# Patient Record
Sex: Female | Born: 1970 | Race: White | Hispanic: No | Marital: Married | Smoking: Former smoker
Health system: Southern US, Community
[De-identification: ages and names within clinical notes are randomized; demographics above are authoritative.]

## PROBLEM LIST (undated history)

## (undated) DIAGNOSIS — F32A Depression, unspecified: Secondary | ICD-10-CM

## (undated) DIAGNOSIS — I10 Essential (primary) hypertension: Secondary | ICD-10-CM

## (undated) DIAGNOSIS — F329 Major depressive disorder, single episode, unspecified: Secondary | ICD-10-CM

## (undated) DIAGNOSIS — F419 Anxiety disorder, unspecified: Secondary | ICD-10-CM

## (undated) HISTORY — DX: Essential (primary) hypertension: I10

## (undated) HISTORY — PX: OOPHORECTOMY: SHX86

## (undated) HISTORY — DX: Anxiety disorder, unspecified: F41.9

## (undated) HISTORY — DX: Major depressive disorder, single episode, unspecified: F32.9

## (undated) HISTORY — DX: Depression, unspecified: F32.A

## (undated) HISTORY — PX: ENDOMETRIAL ABLATION W/ NOVASURE: SUR434

---

## 2008-05-02 ENCOUNTER — Emergency Department (HOSPITAL_BASED_OUTPATIENT_CLINIC_OR_DEPARTMENT_OTHER): Admission: EM | Admit: 2008-05-02 | Discharge: 2008-05-02 | Payer: Self-pay | Admitting: Emergency Medicine

## 2009-01-27 ENCOUNTER — Encounter: Admission: RE | Admit: 2009-01-27 | Discharge: 2009-01-27 | Payer: Self-pay | Admitting: Orthopedic Surgery

## 2010-05-28 IMAGING — CT CT EXTREM LOW W/O CM*R*
2 of 4 series · 4 of 14 positions shown, 5 images · non-contrast
Comparison: Right foot radiographs 05/02/2008

CLINICAL DATA: History of a fifth metatarsal fracture.  Pain and
swelling.

CT RIGHT FOOT WITHOUT CONTRAST
TECHNIQUE: Multidetector CT imaging of the right foot was
performed according to the standard protocol without intravenous
contrast. Multiplanar CT image reconstructions were also generated.
CLINICAL DATA: 3-DIMENSIONAL CT IMAGE RENDERING AT INDEPENDENT WORKSTATION:
TECHNIQUE: 3-dimensional CT images were rendered by post-processing
of the original CT data at independent workstation.  The 3-
dimensional CT images were interpreted, and findings were reported
in the accompanying complete CT report for this study.

[Series 3: rt foot/bone · axial · 0.39mm/px · z∈[-214,-131]mm · 2 of 99 slices shown, 3 images]
[im 33/99  soft-tissue]
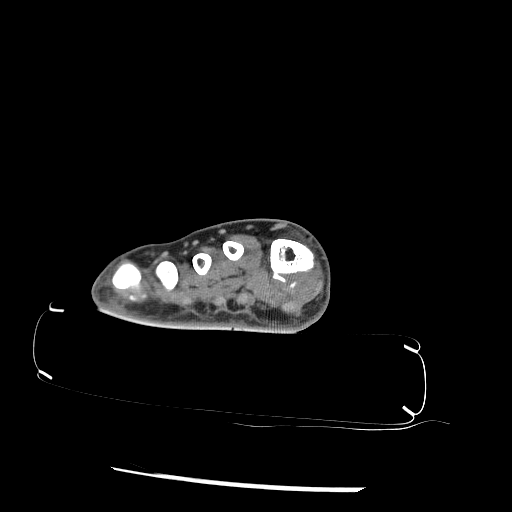
[im 33/99  bone]
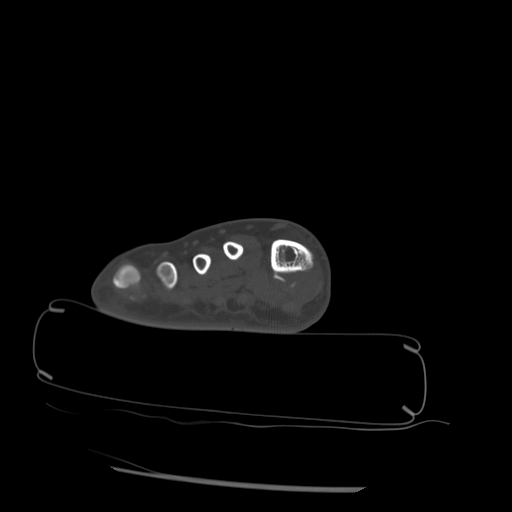
[im 66/99  bone]
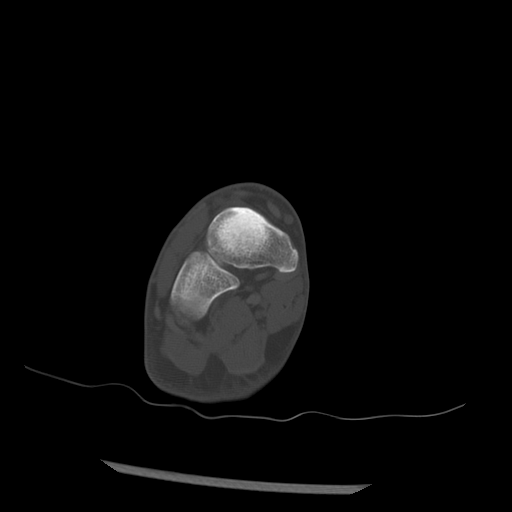

[Series 4: rt foot detail · axial · 0.39mm/px · z∈[-214,-131]mm · 2 of 99 slices shown]
[im 33/99  bone]
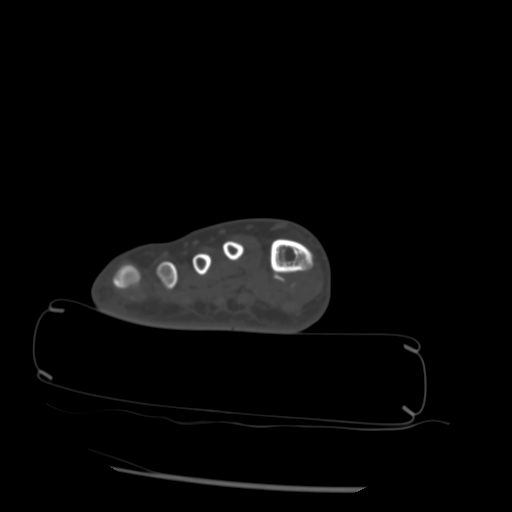
[im 66/99  bone]
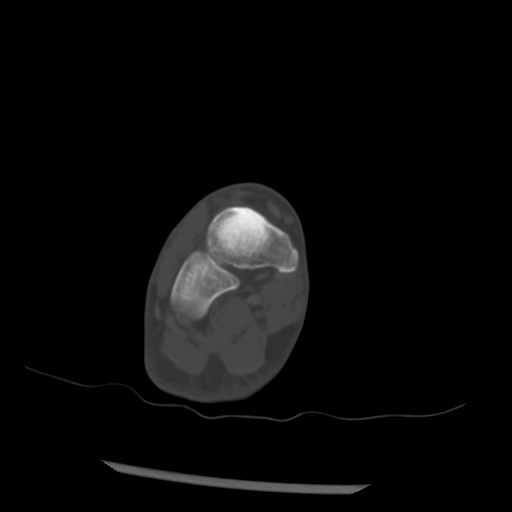

[4 of 14 positions shown; findings below may reference images not displayed]

FINDINGS: There is complete and solid union of the fifth
metatarsal fracture.  At the distal aspect of the fracture site
there is a bony protuberance near the metatarsal neck.  This
projects laterally and could be a potential site for soft tissue
irritation or adventitial bursa formation.  MRI may be helpful for
further evaluation if that is a clinical concern.

There are degenerative changes noted at the first metatarsal
phalangeal joint with joint space narrowing, osteophytic spurring
and early subchondral cystic change. The remaining joint spaces are
maintained.  No arthropathic changes.  No stress fracture or
avascular necrosis.
IMPRESSION: 1.  Complete and solid union of the fifth metatarsal fracture.
2.  Bony spurring change at the distal aspect of the fracture site
laterally near the metatarsal neck.  This could be a source of
adjacent soft tissue irritation or adventitial bursa formation.
3.  Degenerative changes at the first metatarsal phalangeal joint.

## 2013-07-09 ENCOUNTER — Ambulatory Visit: Payer: Self-pay

## 2013-07-09 ENCOUNTER — Encounter: Payer: Self-pay | Admitting: Podiatrist

## 2013-07-09 ENCOUNTER — Ambulatory Visit (INDEPENDENT_AMBULATORY_CARE_PROVIDER_SITE_OTHER): Payer: BC Managed Care – PPO

## 2013-07-09 ENCOUNTER — Ambulatory Visit (INDEPENDENT_AMBULATORY_CARE_PROVIDER_SITE_OTHER): Payer: BC Managed Care – PPO | Admitting: Podiatrist

## 2013-07-09 VITALS — BP 148/83 | HR 72 | Resp 16 | Ht 67.0 in | Wt 185.0 lb

## 2013-07-09 DIAGNOSIS — M2010 Hallux valgus (acquired), unspecified foot: Secondary | ICD-10-CM

## 2013-07-09 DIAGNOSIS — M2011 Hallux valgus (acquired), right foot: Secondary | ICD-10-CM

## 2013-07-09 DIAGNOSIS — M205X9 Other deformities of toe(s) (acquired), unspecified foot: Secondary | ICD-10-CM

## 2013-07-09 DIAGNOSIS — M205X1 Other deformities of toe(s) (acquired), right foot: Secondary | ICD-10-CM

## 2013-07-09 NOTE — Progress Notes (Signed)
MRN: [id number] Name: Judith Franco  Sex: female Age: 42 y.o. DOB: Apr 13, 1971  Provider: Marlowe Aschoff P  Allergies: Aspirin   Chief Complaint  Patient presents with  . Bunions    "I have severe pain in this area on my right foot.  I can hardly wear shoes."     HPI: Patient is 42 y.o. female who presents today for pain on the right foot as she points to the first metatarsal phalangeal joint.  The patient states that she has pain in shoes especially ballet type flats where the shoe cuts across her first metatarsal head. She also states she has pain with dorsiflexion and plantarflexion of the joint and she has noticed swelling of her right foot at the joint compared to the left. Patient states she's been experiencing this pain for several months to a years duration and it's gotten to the point where interrupts her daily activities. She has tried anti-inflammatory medications with minimal relief in symptoms     Past Medical History  Diagnosis Date  . Hypertension   . Anxiety   . Depression        Medication List       This list is accurate as of: 07/09/13  5:32 PM.  Always use your most recent med list.               FLUoxetine 40 MG capsule  Commonly known as:  PROZAC  Take 40 mg by mouth daily.     valsartan 160 MG tablet  Commonly known as:  DIOVAN  Take 160 mg by mouth daily.     zolpidem 5 MG tablet  Commonly known as:  AMBIEN  Take 5 mg by mouth at bedtime as needed for sleep.        Meds ordered this encounter  Medications  . FLUoxetine (PROZAC) 40 MG capsule    Sig: Take 40 mg by mouth daily.  . valsartan (DIOVAN) 160 MG tablet    Sig: Take 160 mg by mouth daily.  Marland Kitchen zolpidem (AMBIEN) 5 MG tablet    Sig: Take 5 mg by mouth at bedtime as needed for sleep.    Past Surgical History  Procedure Laterality Date  . Oophorectomy Right   . Endometrial ablation w/ novasure N/A      History  Substance Use Topics  . Smoking status: Former Games developer  .  Smokeless tobacco: Not on file     Comment: socially  . Alcohol Use: Yes     Comment: socially    Family History  Problem Relation Age of Onset  . Arthritis Mother   . Cancer Mother   . Hyperlipidemia Mother   . Heart disease Father   . Hypertension Father     Review of Systems  DATA OBTAINED: from patient intake form GENERAL: Feels well no fevers, no fatigue, no changes in appetite, + fatigue, + weight change SKIN: No itching, no rashes, no open lesions, no wounds EYES: No eye pain,no redness, no discharge EARS: No earache,no ringing of ears, no recent change in hearing NOSE: No congestion, no drainage, no bleeding  MOUTH/THROAT: No mouth pain, No sore throat, No difficulty chewing or swallowing  RESPIRATORY: No cough, no wheezing, no SOB CARDIAC: No chest pain,no heart palpitations,no new onset lower extremity edema  GI: No abdominal pain, No Nausea, no vomiting, no diarrhea, no heartburn or no reflux  GU: No dysuria, no increased frequency or urgency MUSCULOSKELETAL:swelling of the right foot, arthritis of knees b/l NEUROLOGIC: Awake,  alert, appropriate to situation, No recent change in mental status. PSYCHIATRIC: No overt anxiety or sadness.No behavior issue.  AMBULATION:  Ambulates unassisted  Filed Vitals:   07/09/13 1522  BP: 148/83  Pulse: 72  Resp: 16    Physical Exam  GENERAL APPEARANCE: Alert, conversant. Appropriately groomed. No acute distress.  VASCULAR: Pedal pulses palpable and strong bilateral.  Capillary refill time is immediate to all digits,  Proximal to distal cooling it warm to warm.  Digital hair growth is present bilateral  NEUROLOGIC: sensation is intact epicritically and protectively to 5.07 monofilament at 5/5 sites bilateral.  Light touch is intact bilateral, vibratory sensation intact bilateral, achilles tendon reflex is intact bilateral.  MUSCULOSKELETAL: acceptable muscle strength, tone and stability bilateral.  Intrinsic muscluature intact  bilateral.  Rectus appearance of foot and digits noted left.  The right foot has a large dorsal spur at the first metatarsal head. Swelling about the first metatarsophalangeal joint is also noted right in comparison to the left appear decreased range of motion with crepitus is also seen right. Pain with range of motion and at end range of motion is also noted. DERMATOLOGIC: skin color, texture, and turger are within normal limits.  No preulcerative lesions are seen, no interdigital maceration noted.  No open lesions present.  Digital nails are asymptomatic.    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 Reviewed the x-rays together and x-ray findings were discussed with the patient. Discussed that her arthritis is a progressive deformity and will most likely only get worse. Discussed conservative therapies including stiff soled shoes and decrease in activity. Surgical options were also discussed. We did discuss fixing the joint with an implant versus a fusion. Discussed that because of her age I would recommend a Silastic implant. The patient relates that she does not run and she does not wear high-heeled shoes and I do feel that this implant would be certainly beneficial for her in decreasing her pain and increasing function to her foot. He did go over the consent form patient's questions were encouraged and answered to the best of my ability. Patient was given information regarding the surgery and aftercare and the foot surgery booklet. Will be seen for her surgical correction if she has any questions prior to that visit she will call to be seen one week postoperatively.     Delories Heinz, DPM

## 2013-07-09 NOTE — Patient Instructions (Signed)
You have been diagnosed with hallux limitus (arthritis of your great toe joint).  The procedure to fix this is a joint replacement with a silastic implant.  Please see the "foot surgery" booklet for specific information on the procedure and aftercare.  If you have any questions or concerns regarding the procedure, please let me know

## 2013-07-14 ENCOUNTER — Telehealth: Payer: Self-pay | Admitting: *Deleted

## 2013-07-14 NOTE — Telephone Encounter (Signed)
Pt asked if could change surgery from 07/27/2013 to another Monday.  I was unable to leave a message for pt.

## 2013-08-05 ENCOUNTER — Encounter: Payer: BC Managed Care – PPO | Admitting: Podiatrist

## 2013-08-24 ENCOUNTER — Encounter: Payer: Self-pay | Admitting: Podiatrist

## 2013-08-24 DIAGNOSIS — M2020 Hallux rigidus, unspecified foot: Secondary | ICD-10-CM

## 2013-08-25 ENCOUNTER — Encounter: Payer: Self-pay | Admitting: *Deleted

## 2013-09-02 ENCOUNTER — Ambulatory Visit (INDEPENDENT_AMBULATORY_CARE_PROVIDER_SITE_OTHER): Payer: BC Managed Care – PPO

## 2013-09-02 ENCOUNTER — Ambulatory Visit (INDEPENDENT_AMBULATORY_CARE_PROVIDER_SITE_OTHER): Payer: BC Managed Care – PPO | Admitting: Podiatrist

## 2013-09-02 ENCOUNTER — Encounter: Payer: Self-pay | Admitting: Podiatrist

## 2013-09-02 VITALS — BP 151/90 | HR 71 | Resp 15 | Ht 67.0 in | Wt 185.0 lb

## 2013-09-02 DIAGNOSIS — Z9889 Other specified postprocedural states: Secondary | ICD-10-CM

## 2013-09-02 MED ORDER — HYDROCODONE-ACETAMINOPHEN 5-325 MG PO TABS: 1.0000 | ORAL_TABLET | ORAL | Status: DC | PRN

## 2013-09-02 NOTE — Patient Instructions (Signed)
You may get your foot wet.  Be sure to dry it off well after a shower.  Do not submerge your foot yet-  Start range of motion exercises on your great toe joint.  Popping and cracking are totally normal and you may also notice some swelling afterwards-  Wear an ace bandage or your anklet to help with the swelling.  You may wear either your small surgical shoe. Or your large air fracture walker boot as you prefer.

## 2013-09-02 NOTE — Progress Notes (Signed)
Subjective: Judith Franco presents today one week status post Judith Franco bunion arthroplasty with implant. She states "its doing okay, started work Monday".  She is complaining of some sharp discomfort at night. It may be due to the boot.  Pain medication is helping with her discomfort.  Objective: Excellent clinical appearance of the foot is present. Swelling is minimal incision line is well coapted with sutures and Steri-Strips in place. No redness, no swelling, no malodor, no drainage is present. No pain with calf compression is noted. Overall excellent alignment and position is seen. Great range of motion is also noted.  Assessment: Status post Judith Franco bunion with implant right foot date of surgery 08/24/2013  Plan: Gave instructions for bathing and aftercare. Also dispensed a Darco shoe for her use and instructed her that she may begin the use this as well. Recommended range of motion exercises and I will see her back for recheck in 2 weeks. If any problems or concerns arise prior that visit she is instructed to call.  Marlowe Aschoff DPM

## 2013-09-17 ENCOUNTER — Encounter: Payer: Self-pay | Admitting: Podiatrist

## 2013-09-17 ENCOUNTER — Ambulatory Visit (INDEPENDENT_AMBULATORY_CARE_PROVIDER_SITE_OTHER): Payer: BC Managed Care – PPO | Admitting: Podiatrist

## 2013-09-17 VITALS — BP 154/96 | HR 79 | Resp 12

## 2013-09-17 DIAGNOSIS — Z9889 Other specified postprocedural states: Secondary | ICD-10-CM

## 2013-09-17 NOTE — Progress Notes (Signed)
Subjective: Judith Franco presents today 3 weeks status post Keller bunion arthroplasty with implant. She states "its doing okay". She has been working and been able to prop her foot up. She states it's been swelling a little bit more since she's been working. Overall however she states she's doing pretty good   Objective: Excellent clinical appearance of the foot is seen. Swelling is minimal . The incision is healed. No redness, no swelling, no malodor, no drainage is present. No pain with calf compression is noted. Overall excellent alignment and position is seen. Great range of motion in the great toe joint is also noted.   Assessment: Status post Lorenz Coaster bunion with implant right foot date of surgery 08/24/2013   Plan: Are in are removed patient instructed she may begin to wear a supportive sneaker or tennis shoe.  Recommended continued range of motion exercises and I will see her back for recheck in one month. If any problems or concerns arise prior that visit she is instructed to call.   Marlowe Aschoff DPM

## 2013-09-17 NOTE — Patient Instructions (Signed)
Wear the surgigrip socky things -- which ever size is most comfortable.  Try MEDERMA pm for the incision.  Make sure to move the joint up and down as much as possible.  You can put your foot on the floor and lift up your heel while sitting which is a good exercise.  Or move the toe with your hands.

## 2013-10-05 NOTE — Progress Notes (Signed)
1) Keller bunion implant great toe joint right foot

## 2013-10-12 NOTE — Progress Notes (Signed)
1) Keller bunion implant right foot 

## 2013-10-13 ENCOUNTER — Telehealth: Payer: Self-pay | Admitting: *Deleted

## 2013-10-13 MED ORDER — AMOXICILLIN 500 MG PO CAPS: 1000.0000 mg | ORAL_CAPSULE | Freq: Once | ORAL | Status: DC

## 2013-10-13 NOTE — Telephone Encounter (Signed)
Message copied by Kristian CoveyPREVETTE, Sayra Frisby E on Tue Oct 13, 2013  2:35 PM ------      Message from: Delories HeinzEGERTON, KATHRYN P      Created: Tue Oct 13, 2013 10:36 AM       No we don't  do an antibiotic for dental procedures.  If the dentist would like something called in its amoxicillin 2 grams 1 hour prior to procedure. (which would be 500mg  x 4 tabs-- i think!)  Thanks.        ----- Message -----         From: Kristian CoveyAshley E Abeer Deskins, PMAC         Sent: 10/13/2013   8:24 AM           To: Marlowe AschoffKathryn Egerton, DPM            Pt dental office called this morning asking if pt needed antibiotics prior to her dental cleaning since she had a joint replacement surgery. Requesting an antibiotic be called in.            Do we normally do this? I know it is sometimes necessary for other orthopedic surgery, but was unsure for us?             Just curious what our actual protocol is!       ------

## 2013-10-13 NOTE — Telephone Encounter (Signed)
We have no pharmacy info on the pt. Script was printed and left for to pick up. Notified pt that this was done and gave  Instructions regarding medication. Informed Dr. Tawana ScaleWilson's office as well.

## 2013-10-14 ENCOUNTER — Other Ambulatory Visit: Payer: Self-pay | Admitting: *Deleted

## 2013-10-14 MED ORDER — AMOXICILLIN 500 MG PO CAPS: 1000.0000 mg | ORAL_CAPSULE | Freq: Once | ORAL | Status: DC

## 2013-10-16 ENCOUNTER — Encounter: Payer: Self-pay | Admitting: Podiatrist

## 2013-10-16 ENCOUNTER — Ambulatory Visit (INDEPENDENT_AMBULATORY_CARE_PROVIDER_SITE_OTHER): Payer: BC Managed Care – PPO | Admitting: Podiatrist

## 2013-10-16 ENCOUNTER — Ambulatory Visit (INDEPENDENT_AMBULATORY_CARE_PROVIDER_SITE_OTHER): Payer: BC Managed Care – PPO

## 2013-10-16 VITALS — BP 137/89 | HR 76 | Resp 18

## 2013-10-16 DIAGNOSIS — Z9889 Other specified postprocedural states: Secondary | ICD-10-CM

## 2013-10-16 DIAGNOSIS — Z09 Encounter for follow-up examination after completed treatment for conditions other than malignant neoplasm: Secondary | ICD-10-CM

## 2013-10-16 MED ORDER — HYDROCODONE-ACETAMINOPHEN 5-325 MG PO TABS: 1.0000 | ORAL_TABLET | ORAL | Status: DC | PRN

## 2013-10-16 NOTE — Progress Notes (Signed)
Subjective: Judith Franco 7  weeks status post Lorenz CoasterKeller bunion arthroplasty with implant. She states "It is feeling better and still sore sometimes and the cold weather doesn't help and when I put my shoes on it feels like there is a plank in there". Patient relates she is unable to wear her normal shoes yet-  States all her shoes feel too small due to the continued swelling of the joint.  Overall relates contued improvement at the toe joint.  Objective: Excellent clinical appearance of the foot is seen. Swelling is minimal . The incision is healed. No redness, no swelling, no malodor, no drainage is present. No pain with calf compression is noted. Overall excellent alignment and position is seen. Great range of motion in the great toe joint is also noted.   Assessment: Status post Lorenz CoasterKeller bunion with implant right foot date of surgery 08/24/2013   Plan:Patient encouraged on supportive sneaker or tennis shoe wear . At this point, I discussed the importance of returning to a normal shoe in order for her toe to flex and heal.  Recommended continued range of motion exercises and I will see her back for recheck in one month. If any problems or concerns arise prior that visit she is instructed to call.

## 2013-11-20 ENCOUNTER — Other Ambulatory Visit: Payer: Self-pay | Admitting: *Deleted

## 2013-11-20 ENCOUNTER — Encounter: Payer: Self-pay | Admitting: Podiatrist

## 2013-11-20 ENCOUNTER — Telehealth: Payer: Self-pay | Admitting: *Deleted

## 2013-11-20 ENCOUNTER — Ambulatory Visit (INDEPENDENT_AMBULATORY_CARE_PROVIDER_SITE_OTHER): Payer: BC Managed Care – PPO

## 2013-11-20 ENCOUNTER — Ambulatory Visit (INDEPENDENT_AMBULATORY_CARE_PROVIDER_SITE_OTHER): Payer: BC Managed Care – PPO | Admitting: Podiatrist

## 2013-11-20 VITALS — BP 137/89 | HR 76 | Resp 16

## 2013-11-20 DIAGNOSIS — Z9889 Other specified postprocedural states: Secondary | ICD-10-CM

## 2013-11-20 DIAGNOSIS — R52 Pain, unspecified: Secondary | ICD-10-CM

## 2013-11-20 DIAGNOSIS — M205X9 Other deformities of toe(s) (acquired), unspecified foot: Secondary | ICD-10-CM

## 2013-11-20 DIAGNOSIS — M205X1 Other deformities of toe(s) (acquired), right foot: Secondary | ICD-10-CM

## 2013-11-20 DIAGNOSIS — Z09 Encounter for follow-up examination after completed treatment for conditions other than malignant neoplasm: Secondary | ICD-10-CM

## 2013-11-20 MED ORDER — HYDROCODONE-ACETAMINOPHEN 5-325 MG PO TABS: 1.0000 | ORAL_TABLET | ORAL | Status: DC | PRN

## 2013-11-20 NOTE — Telephone Encounter (Signed)
Dr Irving ShowsEgerton ordered physical therapy from [facility].  See scanned PT rx.

## 2013-11-20 NOTE — Progress Notes (Signed)
Patient presents today for followup of Keller bun arthroplasty with implant right foot , dos11.24.14  Pt states its still sore . She relates that she jammed of the toe in the past and a caused significant discomfort. She also states that she has pain on the bottom of the first metatarsal head which is the most uncomfortable right foot.  Objective: Neurovascular status is intact with palpable pedal pulses at 2/4 DP and PT bilateral. Good range of motion of the first metatarsal phalangeal joint is present compared to preoperative range of motion. Pain along the plantar aspect of the first metatarsal head at the area of the sesamoid and flexor tendon apparatus is noted. Discomfort with dorsiflexion and pressure along the plantar first metatarsal head is noted. Excellent appearance of the incision line is seen in his healing nicely. Some swelling is still present which is resolving slowly.  Assessment: Status post Keller bunion arthroplasty with implant right foot date of surgery 08/24/2013  Plan: At this time I recommended an offloading padding in order to decrease the traction on the flexor tendon apparatus. Several of the pads were made for her in one was attached to her foot at today's visit she was given instructions for use. I also recommended [facility] a physical therapy [place] was [place]. I believe that she had such limitation of range of motion and now that the flexor tendons are able to move they are weekend. I'll see her back in one month for followup and at that time I expect maximal improvement from physical therapy.

## 2013-12-18 ENCOUNTER — Ambulatory Visit (INDEPENDENT_AMBULATORY_CARE_PROVIDER_SITE_OTHER): Payer: BC Managed Care – PPO | Admitting: Podiatrist

## 2013-12-18 ENCOUNTER — Encounter: Payer: Self-pay | Admitting: Podiatrist

## 2013-12-18 ENCOUNTER — Ambulatory Visit (INDEPENDENT_AMBULATORY_CARE_PROVIDER_SITE_OTHER): Payer: BC Managed Care – PPO

## 2013-12-18 VITALS — BP 134/92 | HR 78 | Resp 18

## 2013-12-18 DIAGNOSIS — Z09 Encounter for follow-up examination after completed treatment for conditions other than malignant neoplasm: Secondary | ICD-10-CM

## 2013-12-18 DIAGNOSIS — M205X9 Other deformities of toe(s) (acquired), unspecified foot: Secondary | ICD-10-CM

## 2013-12-18 DIAGNOSIS — M205X1 Other deformities of toe(s) (acquired), right foot: Secondary | ICD-10-CM

## 2013-12-18 DIAGNOSIS — Z9889 Other specified postprocedural states: Secondary | ICD-10-CM

## 2013-12-18 MED ORDER — HYDROCODONE-ACETAMINOPHEN 5-325 MG PO TABS: 1.0000 | ORAL_TABLET | ORAL | Status: DC | PRN

## 2013-12-18 NOTE — Progress Notes (Signed)
   Patient presents today for followup of Keller bun arthroplasty with implant right foot , dos11.24.14 Pt states its still sore . She states that she got his foot caught in a large metal door and it became bruised and uncomfortable. She continues to state that she has pain at the joint and is unable to wear a normal shoe. She has not participated in physical therapy yet but has plans to start.  Objective: Neurovascular status is intact with palpable pedal pulses at 2/4 DP and PT bilateral. Good range of motion of the first metatarsal phalangeal joint is present compared to preoperative range of motion. No specific pain at the plantar aspect of the first metatarsal head is noted.  Generalized discomfort and reports of swelling at the mpj's is noted. Excellent appearance of the incision line is seen in his healing nicely. Swelling continues to resolving slowly.   Assessment: Status post Keller bunion arthroplasty with implant right foot date of surgery 08/24/2013 (4 months post op)  Plan: I again encouraged and recommended physical therapy to Judith Franco in a physical therapy prescription was written for her in [place]. I dispensed surgigrip for her to wear for the swelling.  I am calling in a stricture and scarring medication to [facility].  I will see her again in 1 month for final follow up    Revision History

## 2013-12-29 NOTE — Progress Notes (Unsigned)
Dr. Irving ShowsEgerton ordered Tendinosis-Strictures-Scarring cream Baclofen 2%, Diclofenac 3%, Verapamil 10% dispensed 240gm with 3 refills from [facility].  Faxed.

## 2014-01-15 ENCOUNTER — Ambulatory Visit: Payer: BC Managed Care – PPO | Admitting: Podiatrist

## 2014-01-29 ENCOUNTER — Encounter: Payer: BC Managed Care – PPO | Admitting: Podiatrist

## 2014-01-29 NOTE — Progress Notes (Signed)
Right first This encounter was created in error - please disregard.

## 2014-04-16 ENCOUNTER — Ambulatory Visit (INDEPENDENT_AMBULATORY_CARE_PROVIDER_SITE_OTHER): Payer: BC Managed Care – PPO | Admitting: Podiatrist

## 2014-04-16 ENCOUNTER — Encounter: Payer: Self-pay | Admitting: Podiatrist

## 2014-04-16 ENCOUNTER — Ambulatory Visit (INDEPENDENT_AMBULATORY_CARE_PROVIDER_SITE_OTHER): Payer: BC Managed Care – PPO

## 2014-04-16 DIAGNOSIS — S9032XA Contusion of left foot, initial encounter: Secondary | ICD-10-CM

## 2014-04-16 DIAGNOSIS — S92902A Unspecified fracture of left foot, initial encounter for closed fracture: Secondary | ICD-10-CM

## 2014-04-16 DIAGNOSIS — S9030XA Contusion of unspecified foot, initial encounter: Secondary | ICD-10-CM

## 2014-04-16 DIAGNOSIS — Z9889 Other specified postprocedural states: Secondary | ICD-10-CM

## 2014-04-16 DIAGNOSIS — S92909A Unspecified fracture of unspecified foot, initial encounter for closed fracture: Secondary | ICD-10-CM

## 2014-04-16 MED ORDER — HYDROCODONE-ACETAMINOPHEN 5-325 MG PO TABS: 1.0000 | ORAL_TABLET | ORAL | Status: DC | PRN

## 2014-04-16 NOTE — Progress Notes (Signed)
Larey SeatFell off a ladder two weeks ago and dropped a can of paint on it, it is hurting on the top of the foot, went to urgent care on Sunday and they xrayed and said it was not broken   Chief Complaint  Patient presents with  . Foot Injury    left foot fell off a ladder and dropped a can of paint on it , two weeks ago      HPI: Patient is 43 y.o. female who presents today for pain on the left foot after felling off a ladder when painting.  She states she has had great improvement in the right foot since physical therapy and is overall pleased with its progress.  The left foot has been swollen and painful since falling off the ladder 2 weeks ago.  She was seen at urgent care and no fracture was found.   Physical Exam Patient is awake, alert, and oriented x 3.  In no acute distress.  Vascular status is intact with palpable pedal pulses at 2/4 DP and PT bilateral and capillary refill time within normal limits. Neurological sensation is also intact bilaterally via Semmes Weinstein monofilament at 5/5 sites. Light touch, vibratory sensation, Achilles tendon reflex is intact. Dermatological exam reveals skin color, turger and texture as normal. No open lesions present.  Musculature intact with dorsiflexion, plantarflexion, inversion, eversion. Right first metatarsophalangeal joint has significant increase in range of motion from postoperatively. The area is not bothersome to her any longer. The left foot has an area of discomfort at the second third metatarsal shaft region. X-ray show questionable fracture the second metatarsal shaft which would be cortical in nature. Swelling is also present on the left foot compared to the right.  Assessment: Probable cortical fracture left foot  Plan: Recommended that she go back into an air fracture walker in a short air fracture walking boot was dispensed. She was instructed to wear this consistently for 3 weeks. If he continues to give her trouble after that she will call  otherwise is a self-limiting and should resolve with the use of the boot, ice, elevation, Ace bandages.

## 2015-03-22 ENCOUNTER — Encounter: Payer: Self-pay | Admitting: Podiatry

## 2015-03-22 ENCOUNTER — Ambulatory Visit (INDEPENDENT_AMBULATORY_CARE_PROVIDER_SITE_OTHER): Payer: BLUE CROSS/BLUE SHIELD | Admitting: Podiatry

## 2015-03-22 ENCOUNTER — Ambulatory Visit (INDEPENDENT_AMBULATORY_CARE_PROVIDER_SITE_OTHER): Payer: BLUE CROSS/BLUE SHIELD

## 2015-03-22 VITALS — BP 141/99 | HR 82 | Resp 18

## 2015-03-22 DIAGNOSIS — R52 Pain, unspecified: Secondary | ICD-10-CM

## 2015-03-23 ENCOUNTER — Ambulatory Visit: Payer: Self-pay | Admitting: Podiatry

## 2015-03-23 NOTE — Progress Notes (Signed)
Subjective:     Patient ID: Judith Franco, female   DOB: 1971/08/11, 44 y.o.   MRN: [id number]  HPI patient states that she's had some swelling in her right foot for the last 6 weeks but over the last 10 days it's become quite a bit worse and is hurting on top of the foot. States that she has tried to reduce her activity levels and that does not seem to help and she's currently using his surgical shoe that she had at home   Review of Systems     Objective:   Physical Exam Neurovascular status intact muscle strength adequate negative Homans sign was noted with quite a bit of discomfort in the second metatarsal mid shaft with mild discomfort in the third and fourth but nowhere near the intense this of the second metatarsal pain    Assessment:     Probable stress fracture of the second metatarsal right with edema and cannot rule out any systemic condition or tendinitis    Plan:     Reviewed x-rays with patient and applied Unna boot today in order to try to compress along with Ace bandage and continued surgical shoe usage. Reappoint 3 weeks to re-x-ray and reevaluate

## 2015-04-13 ENCOUNTER — Encounter: Payer: Self-pay | Admitting: Podiatry

## 2015-04-13 ENCOUNTER — Ambulatory Visit (INDEPENDENT_AMBULATORY_CARE_PROVIDER_SITE_OTHER): Payer: BLUE CROSS/BLUE SHIELD

## 2015-04-13 ENCOUNTER — Ambulatory Visit (INDEPENDENT_AMBULATORY_CARE_PROVIDER_SITE_OTHER): Payer: BLUE CROSS/BLUE SHIELD | Admitting: Podiatry

## 2015-04-13 VITALS — BP 139/98 | HR 78 | Resp 15

## 2015-04-13 DIAGNOSIS — S92911D Unspecified fracture of right toe(s), subsequent encounter for fracture with routine healing: Secondary | ICD-10-CM

## 2015-04-13 NOTE — Progress Notes (Signed)
Subjective:     Patient ID: Judith CoupeColleen Droke, female   DOB: [birth year], 44 y.o.   MRN: [id number]  HPI patient states that it seems to be doing some better with continued discomfort if I been a lot of walking and swelling but it has improved   Review of Systems     Objective:   Physical Exam Neurovascular status intact muscle strength adequate with continued discomfort in the right second metatarsal shaft upon ambulation and palpation    Assessment:     Stress fracture present second metatarsal right that's improving but is still present and painful    Plan:     Reviewed x-rays with patient and advised on continued immobilization ice therapy and inflammatory and a gradual healing should occur over the next 4-6 weeks. Gave patient the risk of the adjacent metatarsal possibly fracture

## 2015-06-08 ENCOUNTER — Encounter: Payer: BLUE CROSS/BLUE SHIELD | Admitting: Podiatry

## 2015-07-01 NOTE — Progress Notes (Signed)
This encounter was created in error - please disregard.

## 2015-07-06 ENCOUNTER — Encounter: Payer: Self-pay | Admitting: Podiatry

## 2015-07-06 ENCOUNTER — Ambulatory Visit (INDEPENDENT_AMBULATORY_CARE_PROVIDER_SITE_OTHER): Payer: BLUE CROSS/BLUE SHIELD | Admitting: Podiatry

## 2015-07-06 ENCOUNTER — Ambulatory Visit (INDEPENDENT_AMBULATORY_CARE_PROVIDER_SITE_OTHER): Payer: BLUE CROSS/BLUE SHIELD

## 2015-07-06 VITALS — BP 123/85 | HR 95 | Resp 16

## 2015-07-06 DIAGNOSIS — M8430XD Stress fracture, unspecified site, subsequent encounter for fracture with routine healing: Secondary | ICD-10-CM

## 2015-07-06 DIAGNOSIS — M779 Enthesopathy, unspecified: Secondary | ICD-10-CM | POA: Diagnosis not present

## 2015-07-06 MED ORDER — TRIAMCINOLONE ACETONIDE 10 MG/ML IJ SUSP
10.0000 mg | Freq: Once | INTRAMUSCULAR | Status: AC
  Administered 2015-07-06: 10 mg

## 2015-07-06 NOTE — Progress Notes (Signed)
Subjective:     Patient ID: Judith Franco, female   DOB: 01-05-1971, 44 y.o.   MRN: [id number]  HPI patient states [place] and I developed pain in my right foot not where I had the original stress fracture but over to the side. States it's been making hard to walk comfortably and it's been present for about 6-8 weeks   Review of Systems     Objective:   Physical Exam Neurovascular status intact muscle strength adequate with exquisite discomfort third metatarsal phalangeal joint with fluid buildup around the joint surface and pain when palpated. Patient did have a previous stress fracture second metatarsal and did have some elevation of the toe associated with    Assessment:     Stress fracture right second metatarsal with inflammation consistent with probable capsulitis of the third MPJ right    Plan:     H&P and conditions reviewed with patient. I went ahead today and explained probable third MPJ capsulitis secondary to stress fracture second metatarsal and reviewed both conditions area I did a proximal nerve block I then aspirated the joint getting out a small amount of clear fluid and injected with a quarter cc dexamethasone Kenalog and applied thick plantar pad to reduce stress against the joint surface. Reappoint for [place] to recheck

## 2015-07-20 ENCOUNTER — Ambulatory Visit (INDEPENDENT_AMBULATORY_CARE_PROVIDER_SITE_OTHER): Payer: BLUE CROSS/BLUE SHIELD | Admitting: Podiatry

## 2015-07-20 ENCOUNTER — Encounter: Payer: Self-pay | Admitting: Podiatry

## 2015-07-20 VITALS — BP 135/89 | HR 78 | Resp 16

## 2015-07-20 DIAGNOSIS — M8430XD Stress fracture, unspecified site, subsequent encounter for fracture with routine healing: Secondary | ICD-10-CM

## 2015-07-20 DIAGNOSIS — M779 Enthesopathy, unspecified: Secondary | ICD-10-CM | POA: Diagnosis not present

## 2015-07-20 NOTE — Progress Notes (Signed)
Subjective:     Patient ID: Tedra CoupeColleen Detamore, female   DOB: [birth year], 44 y.o.   MRN: [id number]  HPI patient states it feels a lot better but it is still painful and I am just frustrated because of this delay in my healing   Review of Systems     Objective:   Physical Exam Neurovascular status intact muscle strength adequate with discomfort third MPJ which is present but improved over previous visit with history of stress fracture second metatarsal and hallux limitus surgery right first metatarsal    Assessment:     Reviewed all conditions and recommended orthotics to dispense weight off this joint with inflamed capsule of the third MPJ present    Plan:     Scanned for custom orthotics utilizing plaster technique and deep seat will be utilized of at least 10 mm consistent with Berkley type orthotic. Reappoint when orthotics returned

## 2015-08-12 ENCOUNTER — Ambulatory Visit: Payer: BLUE CROSS/BLUE SHIELD | Admitting: *Deleted

## 2015-08-12 DIAGNOSIS — M779 Enthesopathy, unspecified: Secondary | ICD-10-CM

## 2015-08-12 NOTE — Patient Instructions (Signed)

## 2015-08-12 NOTE — Progress Notes (Signed)
Patient ID: [name], female   DOB: Aug 28, 1971, 44 y.o.   MRN: [id number] Patient presents for orthotic pick up.  Verbal and written break in and wear instructions given.  Patient will follow up in 4 weeks if symptoms worsen or fail to improve.

## 2015-09-09 ENCOUNTER — Ambulatory Visit: Payer: BLUE CROSS/BLUE SHIELD | Admitting: Podiatry

## 2016-06-13 DIAGNOSIS — M779 Enthesopathy, unspecified: Secondary | ICD-10-CM

## 2020-05-03 ENCOUNTER — Other Ambulatory Visit: Payer: Self-pay | Admitting: Orthopedic Surgery

## 2020-05-03 DIAGNOSIS — M259 Joint disorder, unspecified: Secondary | ICD-10-CM

## 2020-05-11 ENCOUNTER — Other Ambulatory Visit: Payer: Self-pay

## 2020-05-12 ENCOUNTER — Other Ambulatory Visit: Payer: Self-pay

## 2020-05-12 ENCOUNTER — Ambulatory Visit
Admission: RE | Admit: 2020-05-12 | Discharge: 2020-05-12 | Disposition: A | Discharge status: Home / Self Care | Payer: Managed Care, Other (non HMO) | Source: Ambulatory Visit | Attending: Orthopedic Surgery | Admitting: Orthopedic Surgery

## 2020-05-12 DIAGNOSIS — M259 Joint disorder, unspecified: Secondary | ICD-10-CM

## 2024-05-21 ENCOUNTER — Other Ambulatory Visit: Payer: Self-pay | Admitting: Family Medicine

## 2024-05-21 DIAGNOSIS — Z1231 Encounter for screening mammogram for malignant neoplasm of breast: Secondary | ICD-10-CM

## 2024-07-10 ENCOUNTER — Ambulatory Visit
Admission: RE | Admit: 2024-07-10 | Discharge: 2024-07-10 | Disposition: A | Discharge status: Home / Self Care | Source: Ambulatory Visit | Attending: Family Medicine | Admitting: Family Medicine

## 2024-07-10 DIAGNOSIS — Z1231 Encounter for screening mammogram for malignant neoplasm of breast: Secondary | ICD-10-CM

## 2024-07-16 ENCOUNTER — Other Ambulatory Visit: Payer: Self-pay | Admitting: Family Medicine

## 2024-07-16 DIAGNOSIS — R928 Other abnormal and inconclusive findings on diagnostic imaging of breast: Secondary | ICD-10-CM

## 2024-10-06 ENCOUNTER — Other Ambulatory Visit: Payer: Self-pay | Admitting: Obstetrics and Gynecology

## 2024-10-06 DIAGNOSIS — R928 Other abnormal and inconclusive findings on diagnostic imaging of breast: Secondary | ICD-10-CM

## 2024-10-09 ENCOUNTER — Ambulatory Visit

## 2024-10-09 ENCOUNTER — Ambulatory Visit
Admission: RE | Admit: 2024-10-09 | Discharge: 2024-10-09 | Disposition: A | Source: Ambulatory Visit | Attending: Family Medicine | Admitting: Family Medicine

## 2024-10-09 DIAGNOSIS — R928 Other abnormal and inconclusive findings on diagnostic imaging of breast: Secondary | ICD-10-CM
